# Patient Record
Sex: Male | Born: 2000 | Race: White | Hispanic: No | Marital: Single | Smoking: Never smoker
Health system: Southern US, Community
[De-identification: ages and names within clinical notes are randomized; demographics above are authoritative.]

## PROBLEM LIST (undated history)

## (undated) DIAGNOSIS — D649 Anemia, unspecified: Secondary | ICD-10-CM

---
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
## 2022-06-27 IMAGING — CT CT ABD-PELV W/ CM
2 of 4 series · 16 of 46 positions shown, 18 images · IV contrast (omnipaque)
Comparison: None.

CLINICAL DATA: Right lower quadrant pain.

EXAM:
CT ABDOMEN AND PELVIS WITH CONTRAST
TECHNIQUE: Multidetector CT imaging of the abdomen and pelvis was performed
using the standard protocol following bolus administration of
intravenous contrast.
CONTRAST:  100mL OMNIPAQUE IOHEXOL 300 MG/ML  SOLN

[Series 2: axial st · axial · 0.68mm/px · z∈[-428,-53]mm · 13 of 86 slices shown, 15 images]
[im 6/86  soft-tissue]
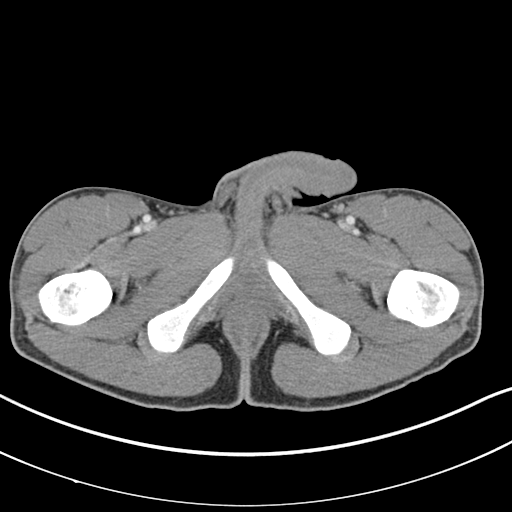
[im 6/86  bone]
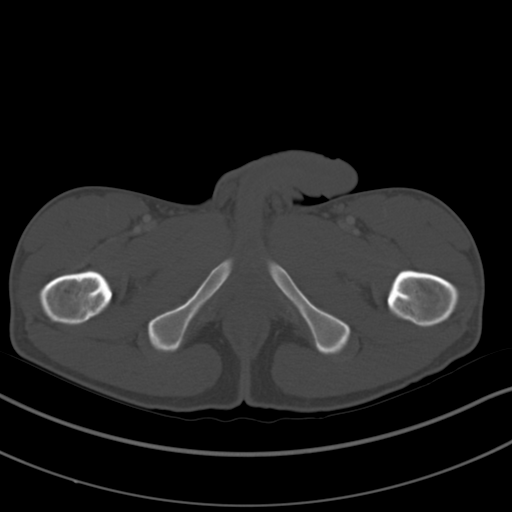
[im 11/86  soft-tissue]
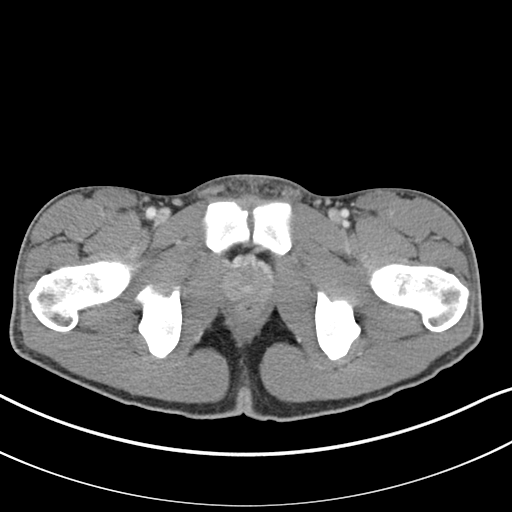
[im 21/86  soft-tissue]
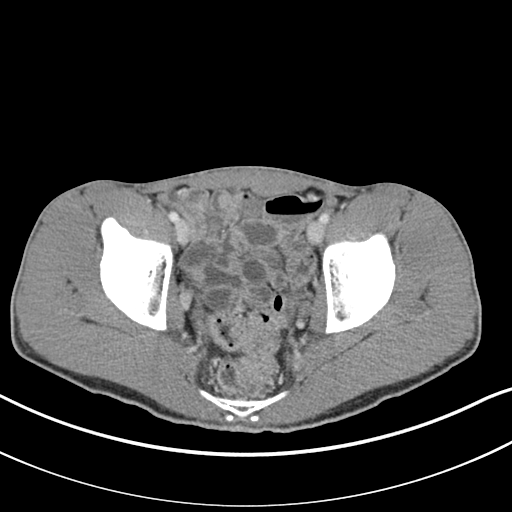
[im 26/86  soft-tissue]
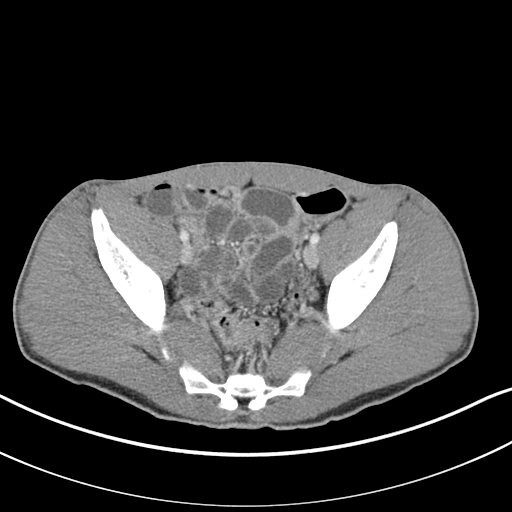
[im 31/86  soft-tissue]
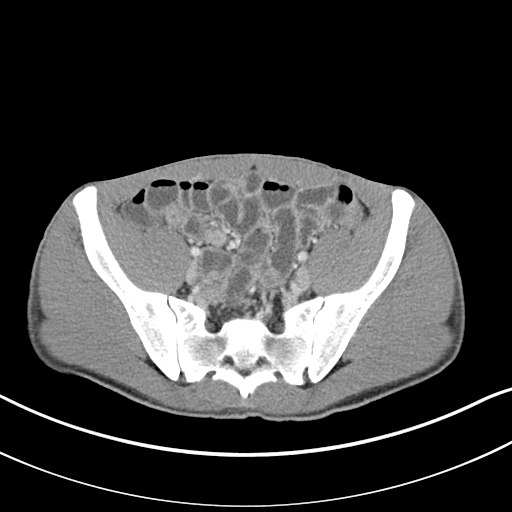
[im 36/86  soft-tissue]
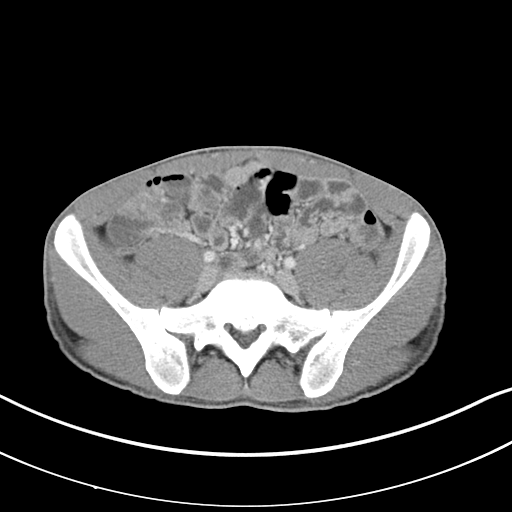
[im 46/86  soft-tissue]
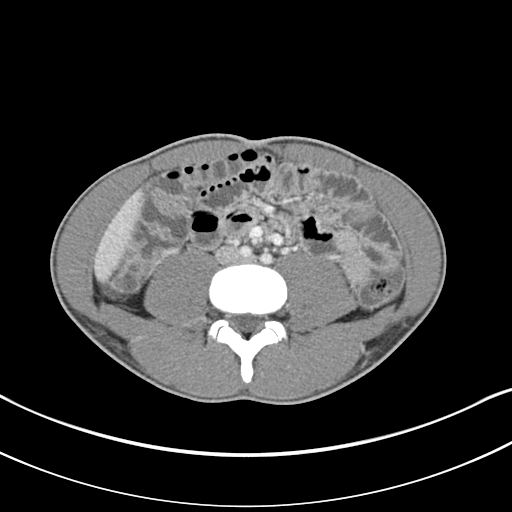
[im 51/86  soft-tissue]
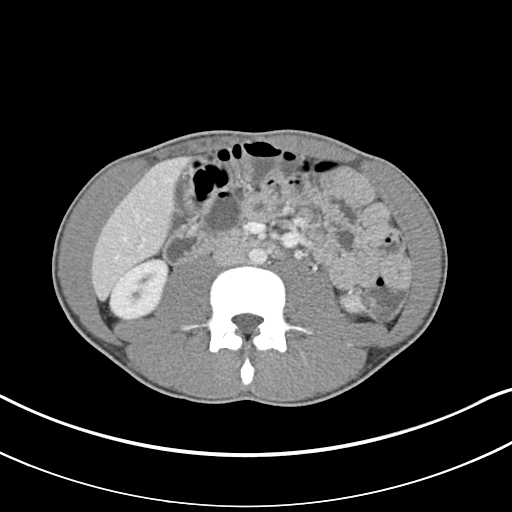
[im 56/86  soft-tissue]
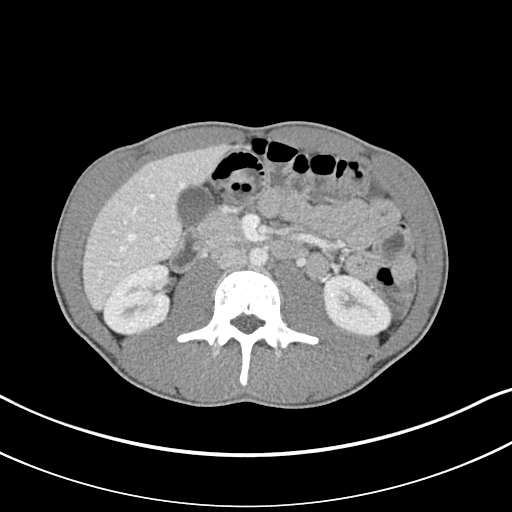
[im 56/86  bone]
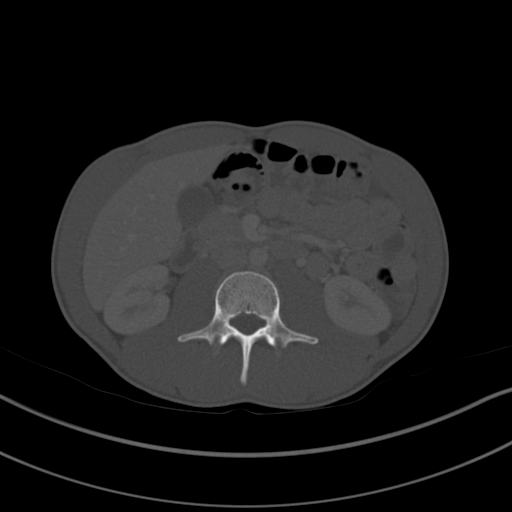
[im 61/86  soft-tissue]
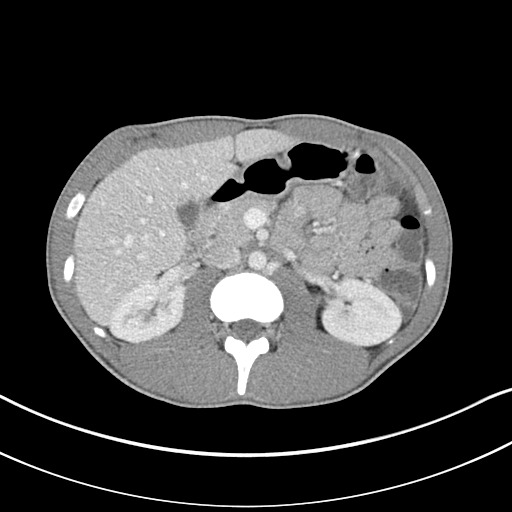
[im 66/86  soft-tissue]
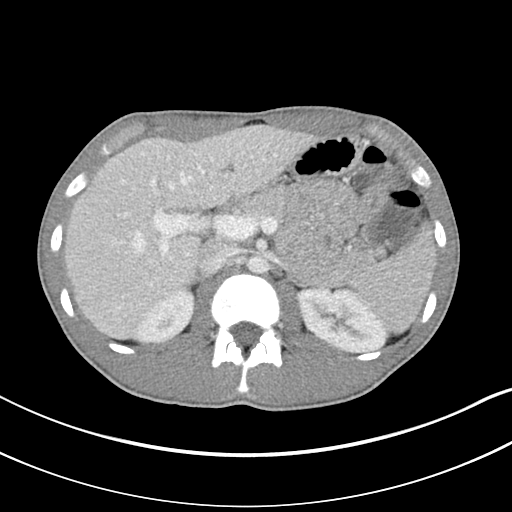
[im 76/86  soft-tissue]
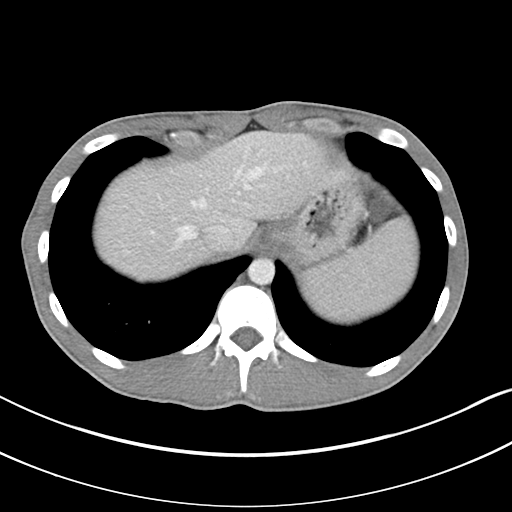
[im 81/86  soft-tissue]
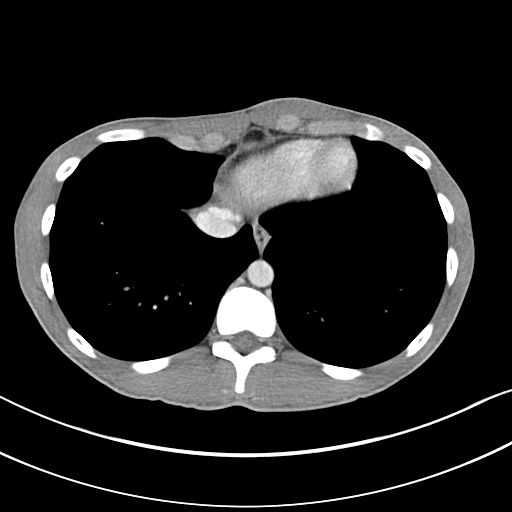

[Series 5: coronal st · coronal · 0.63mm/px · 3 of 115 slices shown]
[im 39/115  soft-tissue]
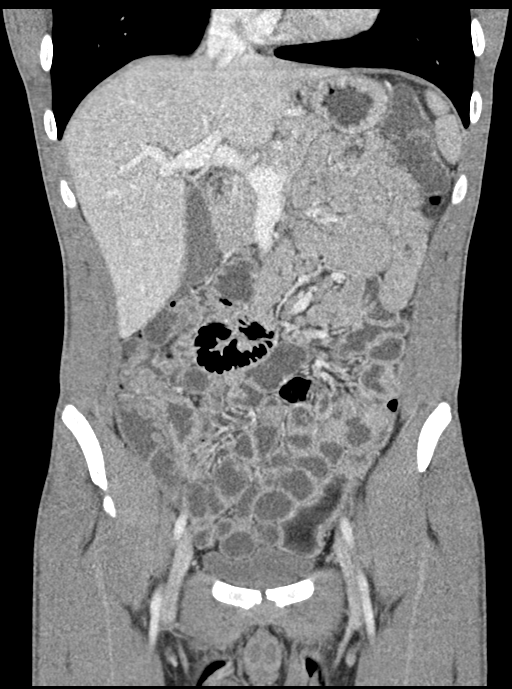
[im 51/115  soft-tissue]
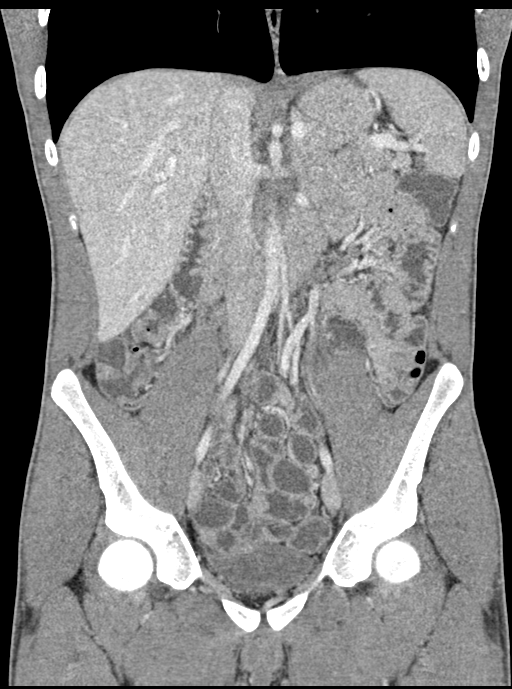
[im 64/115  soft-tissue]
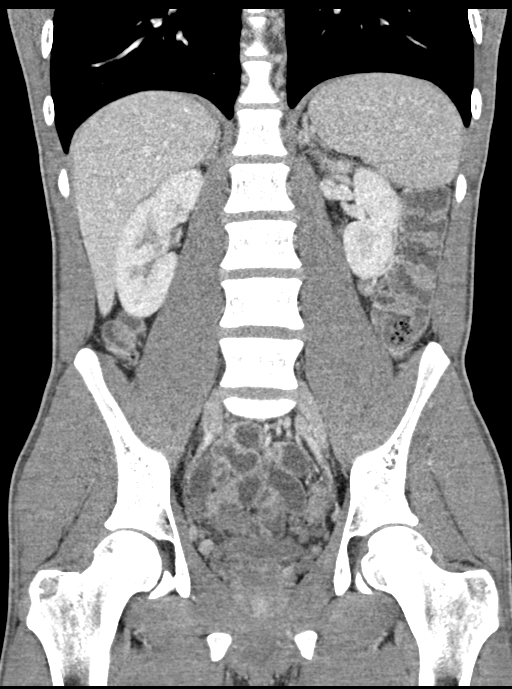

[16 of 46 positions shown; findings below may reference images not displayed]

FINDINGS: Lower chest: The lung bases are clear. The heart size is normal.

Hepatobiliary: The liver is normal. Normal gallbladder.There is no
biliary ductal dilation.

Pancreas: Normal contours without ductal dilatation. No
peripancreatic fluid collection.

Spleen: Unremarkable.

Adrenals/Urinary Tract:

--Adrenal glands: Unremarkable.

--Right kidney/ureter: No hydronephrosis or radiopaque kidney
stones.

--Left kidney/ureter: No hydronephrosis or radiopaque kidney stones.

--Urinary bladder: Unremarkable.

Stomach/Bowel:

--Stomach/Duodenum: No hiatal hernia or other gastric abnormality.
Normal duodenal course and caliber.

--Small bowel: There is scattered fluid-filled nondilated loops of
small bowel throughout the abdomen.

--Colon: There is liquid stool in the colon consistent with a
diarrheal illness.

--Appendix: Normal.

Vascular/Lymphatic: Normal course and caliber of the major abdominal
vessels.

--No retroperitoneal lymphadenopathy.

--No mesenteric lymphadenopathy.

--No pelvic or inguinal lymphadenopathy.

Reproductive: Unremarkable

Other: No ascites or free air. The abdominal wall is normal.

Musculoskeletal. No acute displaced fractures.
IMPRESSION: 1. No evidence for acute appendicitis.
2. Liquid stool in the colon consistent with a diarrheal illness.
3. Nondilated fluid-filled loops of small bowel are noted scattered
throughout the abdomen. These are nonspecific and could represent an
underlying enteritis.
